# Patient Record
Sex: Male | Born: 1990 | Race: White | Hispanic: No | Marital: Married | Smoking: Former smoker
Health system: Southern US, Community
[De-identification: ages and names within clinical notes are randomized; demographics above are authoritative.]

## PROBLEM LIST (undated history)

## (undated) DIAGNOSIS — F988 Other specified behavioral and emotional disorders with onset usually occurring in childhood and adolescence: Secondary | ICD-10-CM

## (undated) HISTORY — PX: TONSILLECTOMY: SUR1361

---

## 2020-03-19 ENCOUNTER — Emergency Department (HOSPITAL_BASED_OUTPATIENT_CLINIC_OR_DEPARTMENT_OTHER): Payer: 59

## 2020-03-19 ENCOUNTER — Emergency Department (HOSPITAL_BASED_OUTPATIENT_CLINIC_OR_DEPARTMENT_OTHER)
Admission: EM | Admit: 2020-03-19 | Discharge: 2020-03-19 | Disposition: A | Discharge status: Home / Self Care | Payer: 59 | Attending: Emergency Medicine | Admitting: Emergency Medicine

## 2020-03-19 ENCOUNTER — Other Ambulatory Visit: Payer: Self-pay

## 2020-03-19 ENCOUNTER — Encounter (HOSPITAL_BASED_OUTPATIENT_CLINIC_OR_DEPARTMENT_OTHER): Payer: Self-pay | Admitting: *Deleted

## 2020-03-19 DIAGNOSIS — F909 Attention-deficit hyperactivity disorder, unspecified type: Secondary | ICD-10-CM | POA: Insufficient documentation

## 2020-03-19 DIAGNOSIS — M549 Dorsalgia, unspecified: Secondary | ICD-10-CM | POA: Insufficient documentation

## 2020-03-19 DIAGNOSIS — M25519 Pain in unspecified shoulder: Secondary | ICD-10-CM | POA: Insufficient documentation

## 2020-03-19 DIAGNOSIS — R0789 Other chest pain: Secondary | ICD-10-CM | POA: Diagnosis present

## 2020-03-19 DIAGNOSIS — Z87891 Personal history of nicotine dependence: Secondary | ICD-10-CM | POA: Insufficient documentation

## 2020-03-19 DIAGNOSIS — M79603 Pain in arm, unspecified: Secondary | ICD-10-CM | POA: Diagnosis not present

## 2020-03-19 DIAGNOSIS — M542 Cervicalgia: Secondary | ICD-10-CM | POA: Diagnosis not present

## 2020-03-19 HISTORY — DX: Other specified behavioral and emotional disorders with onset usually occurring in childhood and adolescence: F98.8

## 2020-03-19 LAB — COMPREHENSIVE METABOLIC PANEL
ALT: 42 U/L (ref 0–44)
AST: 25 U/L (ref 15–41)
Albumin: 4.4 g/dL (ref 3.5–5.0)
Alkaline Phosphatase: 54 U/L (ref 38–126)
Anion gap: 10 (ref 5–15)
BUN: 17 mg/dL (ref 6–20)
CO2: 24 mmol/L (ref 22–32)
Calcium: 9.2 mg/dL (ref 8.9–10.3)
Chloride: 104 mmol/L (ref 98–111)
Creatinine, Ser: 1.17 mg/dL (ref 0.61–1.24)
GFR calc Af Amer: >60 mL/min (ref >60)
GFR calc non Af Amer: >60 mL/min (ref >60)
Glucose, Bld: 96 mg/dL (ref 70–99)
Potassium: 3.8 mmol/L (ref 3.5–5.1)
Sodium: 138 mmol/L (ref 135–145)
Total Bilirubin: 0.6 mg/dL (ref 0.3–1.2)
Total Protein: 7.5 g/dL (ref 6.5–8.1)

## 2020-03-19 LAB — TROPONIN I (HIGH SENSITIVITY)
Troponin I (High Sensitivity): 2 ng/L (ref <18)
Troponin I (High Sensitivity): 3 ng/L (ref <18)

## 2020-03-19 LAB — CBC WITH DIFFERENTIAL/PLATELET
Abs Immature Granulocytes: 0.05 10*3/uL (ref 0.00–0.07)
Basophils Absolute: 0 10*3/uL (ref 0.0–0.1)
Basophils Relative: 0 %
Eosinophils Absolute: 0.4 10*3/uL (ref 0.0–0.5)
Eosinophils Relative: 5 %
HCT: 47.5 % (ref 39.0–52.0)
Hemoglobin: 16.7 g/dL (ref 13.0–17.0)
Immature Granulocytes: 1 %
Lymphocytes Relative: 37 %
Lymphs Abs: 3.3 10*3/uL (ref 0.7–4.0)
MCH: 31.2 pg (ref 26.0–34.0)
MCHC: 35.2 g/dL (ref 30.0–36.0)
MCV: 88.6 fL (ref 80.0–100.0)
Monocytes Absolute: 0.8 10*3/uL (ref 0.1–1.0)
Monocytes Relative: 9 %
Neutro Abs: 4.3 10*3/uL (ref 1.7–7.7)
Neutrophils Relative %: 48 %
Platelets: 272 10*3/uL (ref 150–400)
RBC: 5.36 MIL/uL (ref 4.22–5.81)
RDW: 11.5 % (ref 11.5–15.5)
WBC: 8.9 10*3/uL (ref 4.0–10.5)
nRBC: 0 % (ref 0.0–0.2)
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
## 2020-03-19 NOTE — ED Triage Notes (Signed)
Pt c/o left sided chest pain x 2 episodes today, denies SOB and nausea

## 2020-12-30 IMAGING — CR DG CHEST 2V
2 series · 2 of 2 positions shown · non-contrast
Comparison: None.

CLINICAL DATA: Chest pain

EXAM:
CHEST - 2 VIEW

[w chest pa]
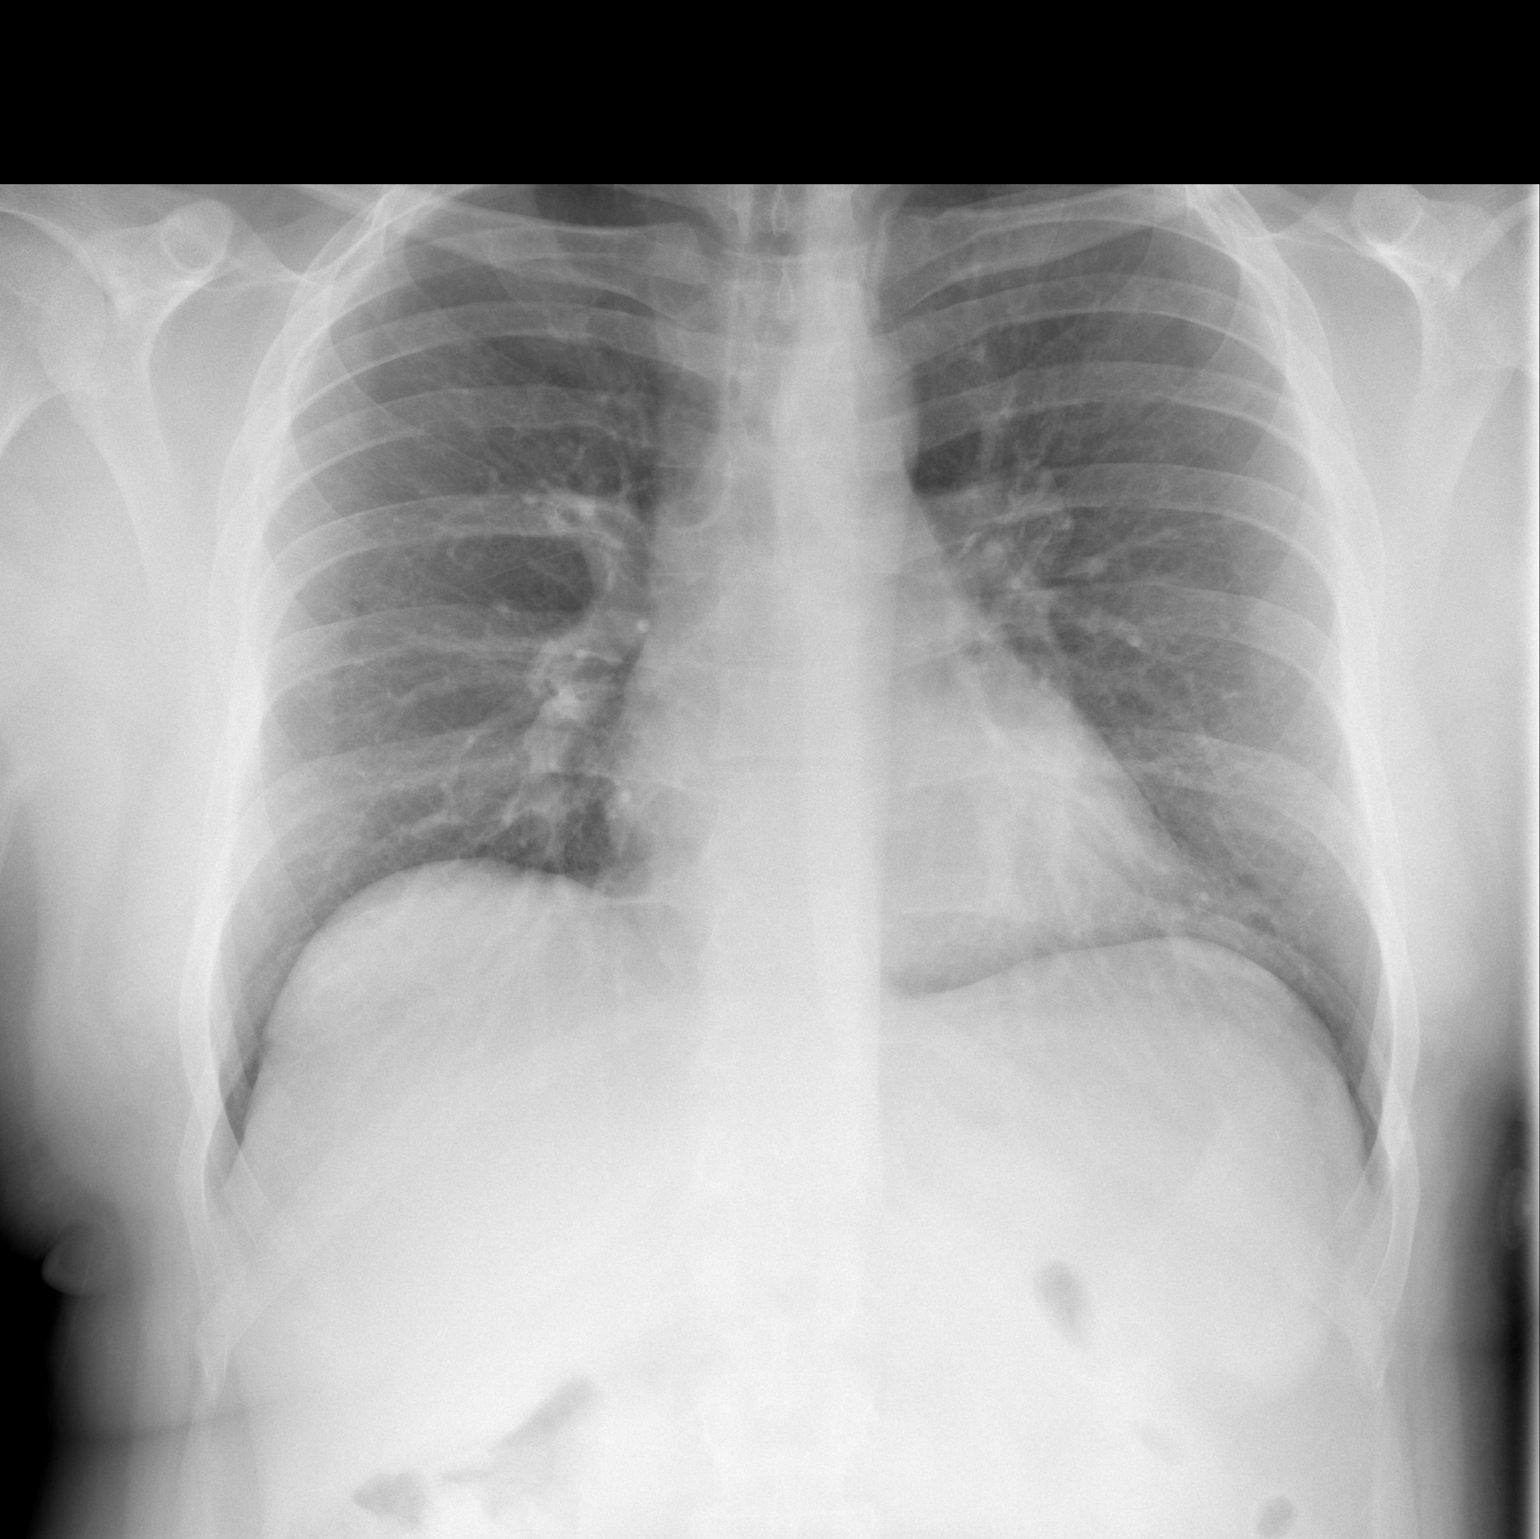

[w chest lat]
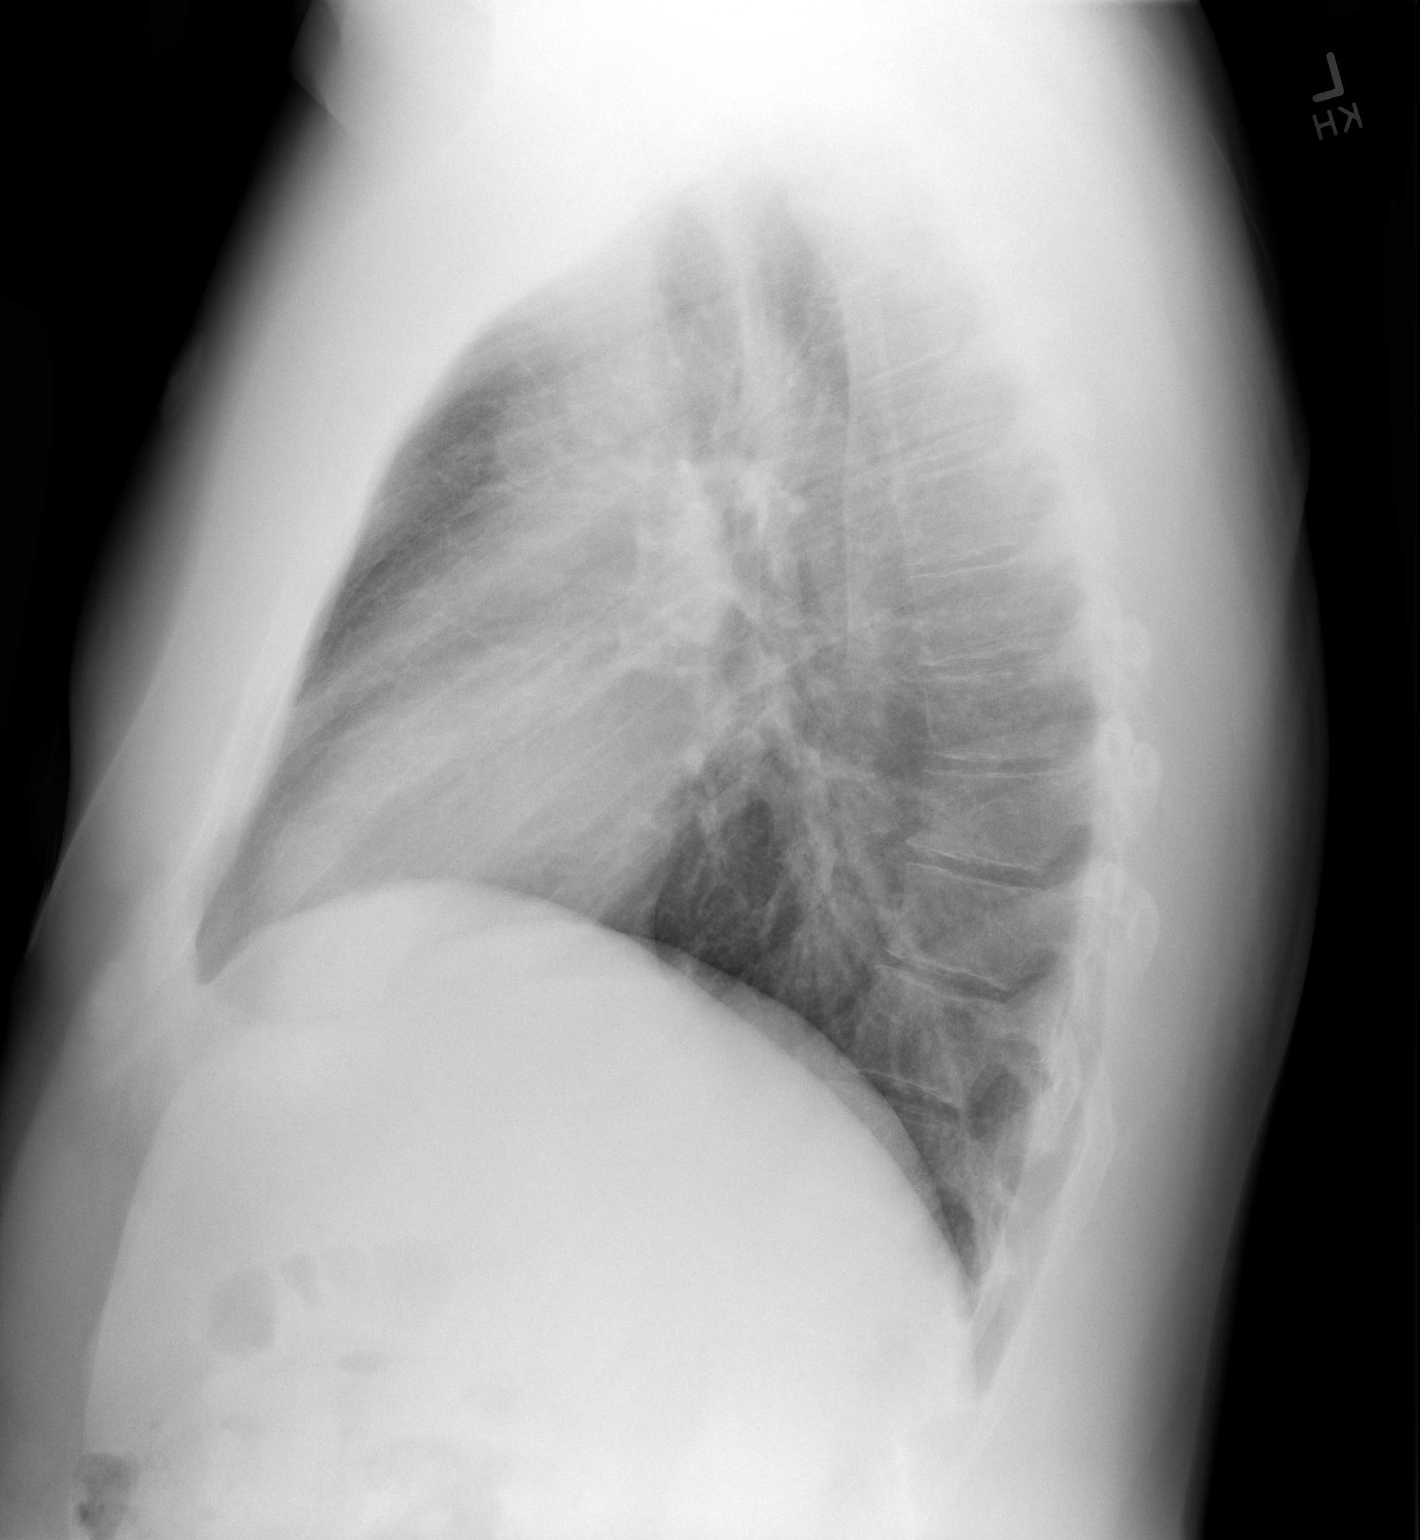

[2 of 2 positions shown; findings below may reference images not displayed]

FINDINGS: The heart size and mediastinal contours are within normal limits.
Both lungs are clear. The visualized skeletal structures are
unremarkable.
IMPRESSION: No active cardiopulmonary disease.
